# Patient Record
Sex: Female | Born: 1995 | Race: White | Hispanic: No | Marital: Single | Smoking: Current every day smoker
Health system: Southern US, Community
[De-identification: ages and names within clinical notes are randomized; demographics above are authoritative.]

## PROBLEM LIST (undated history)

## (undated) DIAGNOSIS — Z789 Other specified health status: Secondary | ICD-10-CM

## (undated) DIAGNOSIS — Q8500 Neurofibromatosis, unspecified: Secondary | ICD-10-CM

## (undated) HISTORY — PX: CARPAL TUNNEL RELEASE: SHX101

---
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
## 2017-08-08 DIAGNOSIS — F331 Major depressive disorder, recurrent, moderate: Secondary | ICD-10-CM | POA: Insufficient documentation

## 2017-08-08 DIAGNOSIS — F4310 Post-traumatic stress disorder, unspecified: Secondary | ICD-10-CM | POA: Insufficient documentation

## 2019-06-29 DIAGNOSIS — M7702 Medial epicondylitis, left elbow: Secondary | ICD-10-CM | POA: Insufficient documentation

## 2019-06-29 DIAGNOSIS — G5622 Lesion of ulnar nerve, left upper limb: Secondary | ICD-10-CM | POA: Insufficient documentation

## 2020-02-07 DIAGNOSIS — K219 Gastro-esophageal reflux disease without esophagitis: Secondary | ICD-10-CM | POA: Diagnosis not present

## 2020-02-07 DIAGNOSIS — F411 Generalized anxiety disorder: Secondary | ICD-10-CM | POA: Diagnosis not present

## 2020-02-07 DIAGNOSIS — F332 Major depressive disorder, recurrent severe without psychotic features: Secondary | ICD-10-CM | POA: Diagnosis not present

## 2020-02-07 DIAGNOSIS — R5383 Other fatigue: Secondary | ICD-10-CM | POA: Diagnosis not present

## 2020-02-07 DIAGNOSIS — Q8501 Neurofibromatosis, type 1: Secondary | ICD-10-CM | POA: Diagnosis not present

## 2020-02-12 DIAGNOSIS — R509 Fever, unspecified: Secondary | ICD-10-CM | POA: Diagnosis not present

## 2020-02-12 DIAGNOSIS — R319 Hematuria, unspecified: Secondary | ICD-10-CM | POA: Diagnosis not present

## 2020-02-12 DIAGNOSIS — R110 Nausea: Secondary | ICD-10-CM | POA: Diagnosis not present

## 2020-02-12 DIAGNOSIS — N136 Pyonephrosis: Secondary | ICD-10-CM | POA: Diagnosis not present

## 2020-02-12 DIAGNOSIS — Z3202 Encounter for pregnancy test, result negative: Secondary | ICD-10-CM | POA: Diagnosis not present

## 2020-02-12 DIAGNOSIS — N200 Calculus of kidney: Secondary | ICD-10-CM | POA: Diagnosis not present

## 2020-02-13 DIAGNOSIS — R319 Hematuria, unspecified: Secondary | ICD-10-CM | POA: Diagnosis not present

## 2020-02-20 DIAGNOSIS — Z975 Presence of (intrauterine) contraceptive device: Secondary | ICD-10-CM | POA: Diagnosis not present

## 2020-02-20 DIAGNOSIS — A599 Trichomoniasis, unspecified: Secondary | ICD-10-CM | POA: Diagnosis not present

## 2020-02-20 DIAGNOSIS — N201 Calculus of ureter: Secondary | ICD-10-CM | POA: Diagnosis not present

## 2020-02-20 DIAGNOSIS — Z87442 Personal history of urinary calculi: Secondary | ICD-10-CM | POA: Diagnosis not present

## 2020-02-20 DIAGNOSIS — R1031 Right lower quadrant pain: Secondary | ICD-10-CM | POA: Diagnosis not present

## 2020-02-20 DIAGNOSIS — R109 Unspecified abdominal pain: Secondary | ICD-10-CM | POA: Diagnosis not present

## 2020-02-24 DIAGNOSIS — Z87442 Personal history of urinary calculi: Secondary | ICD-10-CM | POA: Diagnosis not present

## 2020-02-24 DIAGNOSIS — Z79899 Other long term (current) drug therapy: Secondary | ICD-10-CM | POA: Diagnosis not present

## 2020-02-24 DIAGNOSIS — F419 Anxiety disorder, unspecified: Secondary | ICD-10-CM | POA: Diagnosis not present

## 2020-02-24 DIAGNOSIS — F329 Major depressive disorder, single episode, unspecified: Secondary | ICD-10-CM | POA: Diagnosis not present

## 2020-02-24 DIAGNOSIS — N201 Calculus of ureter: Secondary | ICD-10-CM | POA: Diagnosis not present

## 2020-02-24 DIAGNOSIS — K5900 Constipation, unspecified: Secondary | ICD-10-CM | POA: Diagnosis not present

## 2020-02-24 DIAGNOSIS — Q8500 Neurofibromatosis, unspecified: Secondary | ICD-10-CM | POA: Diagnosis not present

## 2020-02-24 DIAGNOSIS — R42 Dizziness and giddiness: Secondary | ICD-10-CM | POA: Diagnosis not present

## 2020-02-24 DIAGNOSIS — M419 Scoliosis, unspecified: Secondary | ICD-10-CM | POA: Diagnosis not present

## 2020-02-24 DIAGNOSIS — R339 Retention of urine, unspecified: Secondary | ICD-10-CM | POA: Diagnosis not present

## 2020-02-24 DIAGNOSIS — N1330 Unspecified hydronephrosis: Secondary | ICD-10-CM | POA: Diagnosis not present

## 2020-02-24 DIAGNOSIS — R000 Tachycardia, unspecified: Secondary | ICD-10-CM | POA: Diagnosis not present

## 2020-02-24 DIAGNOSIS — N390 Urinary tract infection, site not specified: Secondary | ICD-10-CM | POA: Diagnosis not present

## 2020-02-24 DIAGNOSIS — Z793 Long term (current) use of hormonal contraceptives: Secondary | ICD-10-CM | POA: Diagnosis not present

## 2020-02-24 DIAGNOSIS — N132 Hydronephrosis with renal and ureteral calculous obstruction: Secondary | ICD-10-CM | POA: Diagnosis not present

## 2020-02-24 DIAGNOSIS — F17210 Nicotine dependence, cigarettes, uncomplicated: Secondary | ICD-10-CM | POA: Diagnosis not present

## 2020-02-27 DIAGNOSIS — Z466 Encounter for fitting and adjustment of urinary device: Secondary | ICD-10-CM | POA: Diagnosis not present

## 2020-02-27 DIAGNOSIS — R339 Retention of urine, unspecified: Secondary | ICD-10-CM | POA: Diagnosis not present

## 2020-03-05 DIAGNOSIS — N200 Calculus of kidney: Secondary | ICD-10-CM | POA: Diagnosis not present

## 2020-03-05 DIAGNOSIS — N201 Calculus of ureter: Secondary | ICD-10-CM | POA: Diagnosis not present

## 2020-03-12 DIAGNOSIS — N200 Calculus of kidney: Secondary | ICD-10-CM | POA: Diagnosis not present

## 2020-03-12 DIAGNOSIS — Z8619 Personal history of other infectious and parasitic diseases: Secondary | ICD-10-CM | POA: Diagnosis not present

## 2020-03-12 DIAGNOSIS — F5104 Psychophysiologic insomnia: Secondary | ICD-10-CM | POA: Diagnosis not present

## 2020-03-12 DIAGNOSIS — F332 Major depressive disorder, recurrent severe without psychotic features: Secondary | ICD-10-CM | POA: Diagnosis not present

## 2020-05-16 DIAGNOSIS — J020 Streptococcal pharyngitis: Secondary | ICD-10-CM | POA: Diagnosis not present

## 2020-05-16 DIAGNOSIS — Z20822 Contact with and (suspected) exposure to covid-19: Secondary | ICD-10-CM | POA: Diagnosis not present

## 2020-05-16 DIAGNOSIS — J029 Acute pharyngitis, unspecified: Secondary | ICD-10-CM | POA: Diagnosis not present

## 2020-05-16 DIAGNOSIS — M549 Dorsalgia, unspecified: Secondary | ICD-10-CM | POA: Diagnosis not present

## 2020-05-16 DIAGNOSIS — B349 Viral infection, unspecified: Secondary | ICD-10-CM | POA: Diagnosis not present

## 2020-05-22 DIAGNOSIS — Q8501 Neurofibromatosis, type 1: Secondary | ICD-10-CM | POA: Diagnosis not present

## 2020-05-22 DIAGNOSIS — N644 Mastodynia: Secondary | ICD-10-CM | POA: Diagnosis not present

## 2020-05-22 DIAGNOSIS — F331 Major depressive disorder, recurrent, moderate: Secondary | ICD-10-CM | POA: Diagnosis not present

## 2020-05-22 DIAGNOSIS — F4310 Post-traumatic stress disorder, unspecified: Secondary | ICD-10-CM | POA: Diagnosis not present

## 2020-05-22 DIAGNOSIS — I959 Hypotension, unspecified: Secondary | ICD-10-CM | POA: Diagnosis not present

## 2020-05-22 DIAGNOSIS — F332 Major depressive disorder, recurrent severe without psychotic features: Secondary | ICD-10-CM | POA: Diagnosis not present

## 2020-05-27 DIAGNOSIS — N644 Mastodynia: Secondary | ICD-10-CM | POA: Diagnosis not present

## 2020-07-16 DIAGNOSIS — R6883 Chills (without fever): Secondary | ICD-10-CM | POA: Diagnosis not present

## 2020-07-16 DIAGNOSIS — F32A Depression, unspecified: Secondary | ICD-10-CM | POA: Diagnosis not present

## 2020-07-16 DIAGNOSIS — R059 Cough, unspecified: Secondary | ICD-10-CM | POA: Diagnosis not present

## 2020-07-16 DIAGNOSIS — K0889 Other specified disorders of teeth and supporting structures: Secondary | ICD-10-CM | POA: Diagnosis not present

## 2020-07-16 DIAGNOSIS — F419 Anxiety disorder, unspecified: Secondary | ICD-10-CM | POA: Diagnosis not present

## 2020-07-16 DIAGNOSIS — Z20822 Contact with and (suspected) exposure to covid-19: Secondary | ICD-10-CM | POA: Diagnosis not present

## 2020-07-16 DIAGNOSIS — F17210 Nicotine dependence, cigarettes, uncomplicated: Secondary | ICD-10-CM | POA: Diagnosis not present

## 2020-07-16 DIAGNOSIS — R509 Fever, unspecified: Secondary | ICD-10-CM | POA: Diagnosis not present

## 2020-07-16 DIAGNOSIS — R000 Tachycardia, unspecified: Secondary | ICD-10-CM | POA: Diagnosis not present

## 2020-07-16 DIAGNOSIS — Z79899 Other long term (current) drug therapy: Secondary | ICD-10-CM | POA: Diagnosis not present

## 2020-07-16 DIAGNOSIS — R5383 Other fatigue: Secondary | ICD-10-CM | POA: Diagnosis not present

## 2020-07-16 DIAGNOSIS — Z975 Presence of (intrauterine) contraceptive device: Secondary | ICD-10-CM | POA: Diagnosis not present

## 2020-07-16 DIAGNOSIS — J029 Acute pharyngitis, unspecified: Secondary | ICD-10-CM | POA: Diagnosis not present

## 2020-07-16 DIAGNOSIS — R6884 Jaw pain: Secondary | ICD-10-CM | POA: Diagnosis not present

## 2020-07-19 DIAGNOSIS — F32A Depression, unspecified: Secondary | ICD-10-CM | POA: Diagnosis not present

## 2020-07-19 DIAGNOSIS — Q8500 Neurofibromatosis, unspecified: Secondary | ICD-10-CM | POA: Diagnosis not present

## 2020-07-19 DIAGNOSIS — F419 Anxiety disorder, unspecified: Secondary | ICD-10-CM | POA: Diagnosis not present

## 2020-07-19 DIAGNOSIS — J329 Chronic sinusitis, unspecified: Secondary | ICD-10-CM | POA: Diagnosis not present

## 2020-07-19 DIAGNOSIS — Z975 Presence of (intrauterine) contraceptive device: Secondary | ICD-10-CM | POA: Diagnosis not present

## 2020-07-19 DIAGNOSIS — R509 Fever, unspecified: Secondary | ICD-10-CM | POA: Diagnosis not present

## 2020-07-19 DIAGNOSIS — F17210 Nicotine dependence, cigarettes, uncomplicated: Secondary | ICD-10-CM | POA: Diagnosis not present

## 2020-07-19 DIAGNOSIS — Z79899 Other long term (current) drug therapy: Secondary | ICD-10-CM | POA: Diagnosis not present

## 2020-07-30 DIAGNOSIS — M26622 Arthralgia of left temporomandibular joint: Secondary | ICD-10-CM | POA: Diagnosis not present

## 2020-07-30 DIAGNOSIS — M26629 Arthralgia of temporomandibular joint, unspecified side: Secondary | ICD-10-CM | POA: Diagnosis not present

## 2020-07-30 DIAGNOSIS — R310 Gross hematuria: Secondary | ICD-10-CM | POA: Diagnosis not present

## 2020-09-19 DIAGNOSIS — Q8501 Neurofibromatosis, type 1: Secondary | ICD-10-CM | POA: Diagnosis not present

## 2020-09-19 DIAGNOSIS — M26602 Left temporomandibular joint disorder, unspecified: Secondary | ICD-10-CM | POA: Diagnosis not present

## 2020-09-19 DIAGNOSIS — L813 Cafe au lait spots: Secondary | ICD-10-CM | POA: Diagnosis not present

## 2020-09-19 DIAGNOSIS — Q8500 Neurofibromatosis, unspecified: Secondary | ICD-10-CM | POA: Diagnosis not present

## 2020-09-19 DIAGNOSIS — K5900 Constipation, unspecified: Secondary | ICD-10-CM | POA: Diagnosis not present

## 2020-09-19 DIAGNOSIS — Z973 Presence of spectacles and contact lenses: Secondary | ICD-10-CM | POA: Diagnosis not present

## 2020-09-19 DIAGNOSIS — Z975 Presence of (intrauterine) contraceptive device: Secondary | ICD-10-CM | POA: Diagnosis not present

## 2020-09-19 DIAGNOSIS — Z72820 Sleep deprivation: Secondary | ICD-10-CM | POA: Diagnosis not present

## 2020-09-19 DIAGNOSIS — L812 Freckles: Secondary | ICD-10-CM | POA: Diagnosis not present

## 2020-09-19 DIAGNOSIS — G43109 Migraine with aura, not intractable, without status migrainosus: Secondary | ICD-10-CM | POA: Diagnosis not present

## 2020-09-19 DIAGNOSIS — N6459 Other signs and symptoms in breast: Secondary | ICD-10-CM | POA: Diagnosis not present

## 2020-09-19 DIAGNOSIS — H2189 Other specified disorders of iris and ciliary body: Secondary | ICD-10-CM | POA: Diagnosis not present

## 2020-09-19 DIAGNOSIS — N898 Other specified noninflammatory disorders of vagina: Secondary | ICD-10-CM | POA: Diagnosis not present

## 2020-10-02 DIAGNOSIS — J209 Acute bronchitis, unspecified: Secondary | ICD-10-CM | POA: Diagnosis not present

## 2020-10-02 DIAGNOSIS — Z791 Long term (current) use of non-steroidal anti-inflammatories (NSAID): Secondary | ICD-10-CM | POA: Diagnosis not present

## 2020-10-02 DIAGNOSIS — R059 Cough, unspecified: Secondary | ICD-10-CM | POA: Diagnosis not present

## 2020-10-02 DIAGNOSIS — F17210 Nicotine dependence, cigarettes, uncomplicated: Secondary | ICD-10-CM | POA: Diagnosis not present

## 2020-10-02 DIAGNOSIS — J40 Bronchitis, not specified as acute or chronic: Secondary | ICD-10-CM | POA: Diagnosis not present

## 2020-10-02 DIAGNOSIS — Q8500 Neurofibromatosis, unspecified: Secondary | ICD-10-CM | POA: Diagnosis not present

## 2020-10-02 DIAGNOSIS — Z79899 Other long term (current) drug therapy: Secondary | ICD-10-CM | POA: Diagnosis not present

## 2020-10-02 DIAGNOSIS — F32A Depression, unspecified: Secondary | ICD-10-CM | POA: Diagnosis not present

## 2020-10-02 DIAGNOSIS — Z975 Presence of (intrauterine) contraceptive device: Secondary | ICD-10-CM | POA: Diagnosis not present

## 2020-10-02 DIAGNOSIS — Z20822 Contact with and (suspected) exposure to covid-19: Secondary | ICD-10-CM | POA: Diagnosis not present

## 2020-10-08 DIAGNOSIS — G5602 Carpal tunnel syndrome, left upper limb: Secondary | ICD-10-CM | POA: Diagnosis not present

## 2020-10-08 DIAGNOSIS — G5622 Lesion of ulnar nerve, left upper limb: Secondary | ICD-10-CM | POA: Diagnosis not present

## 2020-10-15 DIAGNOSIS — F909 Attention-deficit hyperactivity disorder, unspecified type: Secondary | ICD-10-CM | POA: Diagnosis not present

## 2020-10-15 DIAGNOSIS — F411 Generalized anxiety disorder: Secondary | ICD-10-CM | POA: Diagnosis not present

## 2020-11-04 DIAGNOSIS — F902 Attention-deficit hyperactivity disorder, combined type: Secondary | ICD-10-CM | POA: Diagnosis not present

## 2020-11-04 DIAGNOSIS — Z01818 Encounter for other preprocedural examination: Secondary | ICD-10-CM | POA: Diagnosis not present

## 2020-11-04 DIAGNOSIS — F439 Reaction to severe stress, unspecified: Secondary | ICD-10-CM | POA: Diagnosis not present

## 2020-11-11 DIAGNOSIS — G5602 Carpal tunnel syndrome, left upper limb: Secondary | ICD-10-CM | POA: Diagnosis not present

## 2020-11-25 DIAGNOSIS — G5602 Carpal tunnel syndrome, left upper limb: Secondary | ICD-10-CM | POA: Diagnosis not present

## 2020-11-25 DIAGNOSIS — Z4789 Encounter for other orthopedic aftercare: Secondary | ICD-10-CM | POA: Diagnosis not present

## 2020-11-27 DIAGNOSIS — F902 Attention-deficit hyperactivity disorder, combined type: Secondary | ICD-10-CM | POA: Diagnosis not present

## 2020-11-28 DIAGNOSIS — R202 Paresthesia of skin: Secondary | ICD-10-CM | POA: Diagnosis not present

## 2020-11-28 DIAGNOSIS — Z789 Other specified health status: Secondary | ICD-10-CM | POA: Diagnosis not present

## 2020-11-28 DIAGNOSIS — R531 Weakness: Secondary | ICD-10-CM | POA: Diagnosis not present

## 2020-11-28 DIAGNOSIS — G5602 Carpal tunnel syndrome, left upper limb: Secondary | ICD-10-CM | POA: Diagnosis not present

## 2020-12-03 DIAGNOSIS — F902 Attention-deficit hyperactivity disorder, combined type: Secondary | ICD-10-CM | POA: Insufficient documentation

## 2020-12-12 DIAGNOSIS — G5602 Carpal tunnel syndrome, left upper limb: Secondary | ICD-10-CM | POA: Diagnosis not present

## 2020-12-12 DIAGNOSIS — R202 Paresthesia of skin: Secondary | ICD-10-CM | POA: Diagnosis not present

## 2020-12-12 DIAGNOSIS — Z789 Other specified health status: Secondary | ICD-10-CM | POA: Diagnosis not present

## 2020-12-12 DIAGNOSIS — R531 Weakness: Secondary | ICD-10-CM | POA: Diagnosis not present

## 2020-12-18 DIAGNOSIS — M25522 Pain in left elbow: Secondary | ICD-10-CM | POA: Diagnosis not present

## 2020-12-18 DIAGNOSIS — Z419 Encounter for procedure for purposes other than remedying health state, unspecified: Secondary | ICD-10-CM | POA: Diagnosis not present

## 2020-12-18 DIAGNOSIS — M7702 Medial epicondylitis, left elbow: Secondary | ICD-10-CM | POA: Diagnosis not present

## 2020-12-18 DIAGNOSIS — R531 Weakness: Secondary | ICD-10-CM | POA: Diagnosis not present

## 2020-12-18 DIAGNOSIS — G5602 Carpal tunnel syndrome, left upper limb: Secondary | ICD-10-CM | POA: Diagnosis not present

## 2020-12-18 DIAGNOSIS — R202 Paresthesia of skin: Secondary | ICD-10-CM | POA: Diagnosis not present

## 2020-12-18 DIAGNOSIS — Z789 Other specified health status: Secondary | ICD-10-CM | POA: Diagnosis not present

## 2020-12-26 DIAGNOSIS — G5602 Carpal tunnel syndrome, left upper limb: Secondary | ICD-10-CM | POA: Diagnosis not present

## 2020-12-26 DIAGNOSIS — M7702 Medial epicondylitis, left elbow: Secondary | ICD-10-CM | POA: Diagnosis not present

## 2020-12-26 DIAGNOSIS — M25522 Pain in left elbow: Secondary | ICD-10-CM | POA: Diagnosis not present

## 2020-12-26 DIAGNOSIS — Z789 Other specified health status: Secondary | ICD-10-CM | POA: Diagnosis not present

## 2020-12-26 DIAGNOSIS — R202 Paresthesia of skin: Secondary | ICD-10-CM | POA: Diagnosis not present

## 2020-12-26 DIAGNOSIS — R531 Weakness: Secondary | ICD-10-CM | POA: Diagnosis not present

## 2020-12-29 DIAGNOSIS — G5622 Lesion of ulnar nerve, left upper limb: Secondary | ICD-10-CM | POA: Diagnosis not present

## 2020-12-29 DIAGNOSIS — Z48811 Encounter for surgical aftercare following surgery on the nervous system: Secondary | ICD-10-CM | POA: Diagnosis not present

## 2020-12-29 DIAGNOSIS — G5602 Carpal tunnel syndrome, left upper limb: Secondary | ICD-10-CM | POA: Diagnosis not present

## 2021-01-01 DIAGNOSIS — F331 Major depressive disorder, recurrent, moderate: Secondary | ICD-10-CM | POA: Diagnosis not present

## 2021-01-01 DIAGNOSIS — F4310 Post-traumatic stress disorder, unspecified: Secondary | ICD-10-CM | POA: Diagnosis not present

## 2021-01-01 DIAGNOSIS — F902 Attention-deficit hyperactivity disorder, combined type: Secondary | ICD-10-CM | POA: Diagnosis not present

## 2021-01-01 DIAGNOSIS — F411 Generalized anxiety disorder: Secondary | ICD-10-CM | POA: Diagnosis not present

## 2021-01-01 DIAGNOSIS — F603 Borderline personality disorder: Secondary | ICD-10-CM | POA: Diagnosis not present

## 2021-01-05 DIAGNOSIS — R202 Paresthesia of skin: Secondary | ICD-10-CM | POA: Diagnosis not present

## 2021-01-05 DIAGNOSIS — Z789 Other specified health status: Secondary | ICD-10-CM | POA: Diagnosis not present

## 2021-01-05 DIAGNOSIS — M25522 Pain in left elbow: Secondary | ICD-10-CM | POA: Diagnosis not present

## 2021-01-05 DIAGNOSIS — R531 Weakness: Secondary | ICD-10-CM | POA: Diagnosis not present

## 2021-01-05 DIAGNOSIS — G5602 Carpal tunnel syndrome, left upper limb: Secondary | ICD-10-CM | POA: Diagnosis not present

## 2021-01-05 DIAGNOSIS — M7702 Medial epicondylitis, left elbow: Secondary | ICD-10-CM | POA: Diagnosis not present

## 2021-01-08 DIAGNOSIS — F4310 Post-traumatic stress disorder, unspecified: Secondary | ICD-10-CM | POA: Diagnosis not present

## 2021-01-08 DIAGNOSIS — F410 Panic disorder [episodic paroxysmal anxiety] without agoraphobia: Secondary | ICD-10-CM | POA: Diagnosis not present

## 2021-01-08 DIAGNOSIS — Z8659 Personal history of other mental and behavioral disorders: Secondary | ICD-10-CM | POA: Diagnosis not present

## 2021-01-08 DIAGNOSIS — F902 Attention-deficit hyperactivity disorder, combined type: Secondary | ICD-10-CM | POA: Diagnosis not present

## 2021-01-08 DIAGNOSIS — F411 Generalized anxiety disorder: Secondary | ICD-10-CM | POA: Diagnosis not present

## 2021-01-08 DIAGNOSIS — F331 Major depressive disorder, recurrent, moderate: Secondary | ICD-10-CM | POA: Diagnosis not present

## 2021-01-09 DIAGNOSIS — G5602 Carpal tunnel syndrome, left upper limb: Secondary | ICD-10-CM | POA: Diagnosis not present

## 2021-01-09 DIAGNOSIS — R531 Weakness: Secondary | ICD-10-CM | POA: Diagnosis not present

## 2021-01-09 DIAGNOSIS — R202 Paresthesia of skin: Secondary | ICD-10-CM | POA: Diagnosis not present

## 2021-01-09 DIAGNOSIS — M25522 Pain in left elbow: Secondary | ICD-10-CM | POA: Diagnosis not present

## 2021-01-09 DIAGNOSIS — M7702 Medial epicondylitis, left elbow: Secondary | ICD-10-CM | POA: Diagnosis not present

## 2021-01-09 DIAGNOSIS — Z789 Other specified health status: Secondary | ICD-10-CM | POA: Diagnosis not present

## 2021-01-18 DIAGNOSIS — Z419 Encounter for procedure for purposes other than remedying health state, unspecified: Secondary | ICD-10-CM | POA: Diagnosis not present

## 2021-01-23 DIAGNOSIS — Z789 Other specified health status: Secondary | ICD-10-CM | POA: Diagnosis not present

## 2021-01-23 DIAGNOSIS — R202 Paresthesia of skin: Secondary | ICD-10-CM | POA: Diagnosis not present

## 2021-01-23 DIAGNOSIS — M7702 Medial epicondylitis, left elbow: Secondary | ICD-10-CM | POA: Diagnosis not present

## 2021-01-23 DIAGNOSIS — M25522 Pain in left elbow: Secondary | ICD-10-CM | POA: Diagnosis not present

## 2021-01-23 DIAGNOSIS — R531 Weakness: Secondary | ICD-10-CM | POA: Diagnosis not present

## 2021-01-23 DIAGNOSIS — G5602 Carpal tunnel syndrome, left upper limb: Secondary | ICD-10-CM | POA: Diagnosis not present

## 2021-01-29 DIAGNOSIS — Z789 Other specified health status: Secondary | ICD-10-CM | POA: Diagnosis not present

## 2021-01-29 DIAGNOSIS — M7702 Medial epicondylitis, left elbow: Secondary | ICD-10-CM | POA: Diagnosis not present

## 2021-01-29 DIAGNOSIS — R531 Weakness: Secondary | ICD-10-CM | POA: Diagnosis not present

## 2021-01-29 DIAGNOSIS — M25522 Pain in left elbow: Secondary | ICD-10-CM | POA: Diagnosis not present

## 2021-01-29 DIAGNOSIS — R202 Paresthesia of skin: Secondary | ICD-10-CM | POA: Diagnosis not present

## 2021-01-29 DIAGNOSIS — G5602 Carpal tunnel syndrome, left upper limb: Secondary | ICD-10-CM | POA: Diagnosis not present

## 2021-02-04 DIAGNOSIS — F902 Attention-deficit hyperactivity disorder, combined type: Secondary | ICD-10-CM | POA: Diagnosis not present

## 2021-02-04 DIAGNOSIS — F411 Generalized anxiety disorder: Secondary | ICD-10-CM | POA: Diagnosis not present

## 2021-02-04 DIAGNOSIS — F331 Major depressive disorder, recurrent, moderate: Secondary | ICD-10-CM | POA: Diagnosis not present

## 2021-02-04 DIAGNOSIS — F603 Borderline personality disorder: Secondary | ICD-10-CM | POA: Diagnosis not present

## 2021-02-04 DIAGNOSIS — F4310 Post-traumatic stress disorder, unspecified: Secondary | ICD-10-CM | POA: Diagnosis not present

## 2021-02-10 DIAGNOSIS — R531 Weakness: Secondary | ICD-10-CM | POA: Diagnosis not present

## 2021-02-10 DIAGNOSIS — Z789 Other specified health status: Secondary | ICD-10-CM | POA: Diagnosis not present

## 2021-02-10 DIAGNOSIS — M25522 Pain in left elbow: Secondary | ICD-10-CM | POA: Diagnosis not present

## 2021-02-10 DIAGNOSIS — G5602 Carpal tunnel syndrome, left upper limb: Secondary | ICD-10-CM | POA: Diagnosis not present

## 2021-02-10 DIAGNOSIS — R202 Paresthesia of skin: Secondary | ICD-10-CM | POA: Diagnosis not present

## 2021-02-10 DIAGNOSIS — M7702 Medial epicondylitis, left elbow: Secondary | ICD-10-CM | POA: Diagnosis not present

## 2021-02-12 DIAGNOSIS — G5602 Carpal tunnel syndrome, left upper limb: Secondary | ICD-10-CM | POA: Diagnosis not present

## 2021-02-12 DIAGNOSIS — M7702 Medial epicondylitis, left elbow: Secondary | ICD-10-CM | POA: Diagnosis not present

## 2021-02-12 DIAGNOSIS — R531 Weakness: Secondary | ICD-10-CM | POA: Diagnosis not present

## 2021-02-12 DIAGNOSIS — Z789 Other specified health status: Secondary | ICD-10-CM | POA: Diagnosis not present

## 2021-02-12 DIAGNOSIS — M25522 Pain in left elbow: Secondary | ICD-10-CM | POA: Diagnosis not present

## 2021-02-12 DIAGNOSIS — R202 Paresthesia of skin: Secondary | ICD-10-CM | POA: Diagnosis not present

## 2021-02-18 DIAGNOSIS — Z419 Encounter for procedure for purposes other than remedying health state, unspecified: Secondary | ICD-10-CM | POA: Diagnosis not present

## 2021-02-19 DIAGNOSIS — Z8659 Personal history of other mental and behavioral disorders: Secondary | ICD-10-CM | POA: Diagnosis not present

## 2021-02-19 DIAGNOSIS — F902 Attention-deficit hyperactivity disorder, combined type: Secondary | ICD-10-CM | POA: Diagnosis not present

## 2021-02-19 DIAGNOSIS — F410 Panic disorder [episodic paroxysmal anxiety] without agoraphobia: Secondary | ICD-10-CM | POA: Diagnosis not present

## 2021-02-19 DIAGNOSIS — F331 Major depressive disorder, recurrent, moderate: Secondary | ICD-10-CM | POA: Diagnosis not present

## 2021-02-19 DIAGNOSIS — F411 Generalized anxiety disorder: Secondary | ICD-10-CM | POA: Diagnosis not present

## 2021-02-19 DIAGNOSIS — F4310 Post-traumatic stress disorder, unspecified: Secondary | ICD-10-CM | POA: Diagnosis not present

## 2021-03-04 DIAGNOSIS — F603 Borderline personality disorder: Secondary | ICD-10-CM | POA: Diagnosis not present

## 2021-03-04 DIAGNOSIS — F902 Attention-deficit hyperactivity disorder, combined type: Secondary | ICD-10-CM | POA: Diagnosis not present

## 2021-03-04 DIAGNOSIS — F411 Generalized anxiety disorder: Secondary | ICD-10-CM | POA: Diagnosis not present

## 2021-03-04 DIAGNOSIS — F331 Major depressive disorder, recurrent, moderate: Secondary | ICD-10-CM | POA: Diagnosis not present

## 2021-03-04 DIAGNOSIS — F4310 Post-traumatic stress disorder, unspecified: Secondary | ICD-10-CM | POA: Diagnosis not present

## 2021-03-18 DIAGNOSIS — Z419 Encounter for procedure for purposes other than remedying health state, unspecified: Secondary | ICD-10-CM | POA: Diagnosis not present

## 2021-03-25 DIAGNOSIS — Z8659 Personal history of other mental and behavioral disorders: Secondary | ICD-10-CM | POA: Diagnosis not present

## 2021-03-25 DIAGNOSIS — F902 Attention-deficit hyperactivity disorder, combined type: Secondary | ICD-10-CM | POA: Diagnosis not present

## 2021-03-25 DIAGNOSIS — F411 Generalized anxiety disorder: Secondary | ICD-10-CM | POA: Diagnosis not present

## 2021-03-25 DIAGNOSIS — F410 Panic disorder [episodic paroxysmal anxiety] without agoraphobia: Secondary | ICD-10-CM | POA: Diagnosis not present

## 2021-03-25 DIAGNOSIS — F331 Major depressive disorder, recurrent, moderate: Secondary | ICD-10-CM | POA: Diagnosis not present

## 2021-04-18 DIAGNOSIS — Z419 Encounter for procedure for purposes other than remedying health state, unspecified: Secondary | ICD-10-CM | POA: Diagnosis not present

## 2021-04-27 DIAGNOSIS — Z3201 Encounter for pregnancy test, result positive: Secondary | ICD-10-CM | POA: Diagnosis not present

## 2021-04-29 DIAGNOSIS — Z3201 Encounter for pregnancy test, result positive: Secondary | ICD-10-CM | POA: Diagnosis not present

## 2021-05-01 DIAGNOSIS — F331 Major depressive disorder, recurrent, moderate: Secondary | ICD-10-CM | POA: Diagnosis not present

## 2021-05-01 DIAGNOSIS — F17200 Nicotine dependence, unspecified, uncomplicated: Secondary | ICD-10-CM | POA: Insufficient documentation

## 2021-05-01 DIAGNOSIS — F411 Generalized anxiety disorder: Secondary | ICD-10-CM | POA: Diagnosis not present

## 2021-05-01 DIAGNOSIS — F4310 Post-traumatic stress disorder, unspecified: Secondary | ICD-10-CM | POA: Diagnosis not present

## 2021-05-01 DIAGNOSIS — F410 Panic disorder [episodic paroxysmal anxiety] without agoraphobia: Secondary | ICD-10-CM | POA: Diagnosis not present

## 2021-05-01 DIAGNOSIS — F902 Attention-deficit hyperactivity disorder, combined type: Secondary | ICD-10-CM | POA: Diagnosis not present

## 2021-05-01 DIAGNOSIS — Z8659 Personal history of other mental and behavioral disorders: Secondary | ICD-10-CM | POA: Diagnosis not present

## 2021-05-05 DIAGNOSIS — F17210 Nicotine dependence, cigarettes, uncomplicated: Secondary | ICD-10-CM | POA: Diagnosis not present

## 2021-05-05 DIAGNOSIS — O0001 Abdominal pregnancy with intrauterine pregnancy: Secondary | ICD-10-CM | POA: Diagnosis not present

## 2021-05-05 DIAGNOSIS — Z79899 Other long term (current) drug therapy: Secondary | ICD-10-CM | POA: Diagnosis not present

## 2021-05-05 DIAGNOSIS — O26899 Other specified pregnancy related conditions, unspecified trimester: Secondary | ICD-10-CM | POA: Diagnosis not present

## 2021-05-05 DIAGNOSIS — Z3A01 Less than 8 weeks gestation of pregnancy: Secondary | ICD-10-CM | POA: Diagnosis not present

## 2021-05-05 DIAGNOSIS — O2651 Maternal hypotension syndrome, first trimester: Secondary | ICD-10-CM | POA: Diagnosis not present

## 2021-05-05 DIAGNOSIS — I951 Orthostatic hypotension: Secondary | ICD-10-CM | POA: Diagnosis not present

## 2021-05-05 DIAGNOSIS — F419 Anxiety disorder, unspecified: Secondary | ICD-10-CM | POA: Diagnosis not present

## 2021-05-05 DIAGNOSIS — F339 Major depressive disorder, recurrent, unspecified: Secondary | ICD-10-CM | POA: Diagnosis not present

## 2021-05-05 DIAGNOSIS — O26891 Other specified pregnancy related conditions, first trimester: Secondary | ICD-10-CM | POA: Diagnosis not present

## 2021-05-13 DIAGNOSIS — Z3687 Encounter for antenatal screening for uncertain dates: Secondary | ICD-10-CM | POA: Diagnosis not present

## 2021-05-13 DIAGNOSIS — Z3481 Encounter for supervision of other normal pregnancy, first trimester: Secondary | ICD-10-CM | POA: Diagnosis not present

## 2021-05-18 DIAGNOSIS — Z419 Encounter for procedure for purposes other than remedying health state, unspecified: Secondary | ICD-10-CM | POA: Diagnosis not present

## 2021-05-22 DIAGNOSIS — F17210 Nicotine dependence, cigarettes, uncomplicated: Secondary | ICD-10-CM | POA: Diagnosis not present

## 2021-05-22 DIAGNOSIS — U071 COVID-19: Secondary | ICD-10-CM | POA: Diagnosis not present

## 2021-05-22 DIAGNOSIS — Z3A01 Less than 8 weeks gestation of pregnancy: Secondary | ICD-10-CM | POA: Diagnosis not present

## 2021-05-22 DIAGNOSIS — R509 Fever, unspecified: Secondary | ICD-10-CM | POA: Diagnosis not present

## 2021-05-22 DIAGNOSIS — Z3A08 8 weeks gestation of pregnancy: Secondary | ICD-10-CM | POA: Diagnosis not present

## 2021-05-22 DIAGNOSIS — R0981 Nasal congestion: Secondary | ICD-10-CM | POA: Diagnosis not present

## 2021-05-22 DIAGNOSIS — M791 Myalgia, unspecified site: Secondary | ICD-10-CM | POA: Diagnosis not present

## 2021-05-22 DIAGNOSIS — O26891 Other specified pregnancy related conditions, first trimester: Secondary | ICD-10-CM | POA: Diagnosis not present

## 2021-05-22 DIAGNOSIS — R5383 Other fatigue: Secondary | ICD-10-CM | POA: Diagnosis not present

## 2021-05-22 DIAGNOSIS — Z79899 Other long term (current) drug therapy: Secondary | ICD-10-CM | POA: Diagnosis not present

## 2021-05-24 DIAGNOSIS — Z3A08 8 weeks gestation of pregnancy: Secondary | ICD-10-CM | POA: Diagnosis not present

## 2021-05-24 DIAGNOSIS — O200 Threatened abortion: Secondary | ICD-10-CM | POA: Diagnosis not present

## 2021-05-25 DIAGNOSIS — O26851 Spotting complicating pregnancy, first trimester: Secondary | ICD-10-CM | POA: Diagnosis not present

## 2021-06-05 DIAGNOSIS — F411 Generalized anxiety disorder: Secondary | ICD-10-CM | POA: Diagnosis not present

## 2021-06-05 DIAGNOSIS — F902 Attention-deficit hyperactivity disorder, combined type: Secondary | ICD-10-CM | POA: Diagnosis not present

## 2021-06-05 DIAGNOSIS — Z8659 Personal history of other mental and behavioral disorders: Secondary | ICD-10-CM | POA: Diagnosis not present

## 2021-06-05 DIAGNOSIS — F330 Major depressive disorder, recurrent, mild: Secondary | ICD-10-CM | POA: Diagnosis not present

## 2021-06-05 DIAGNOSIS — F4310 Post-traumatic stress disorder, unspecified: Secondary | ICD-10-CM | POA: Diagnosis not present

## 2021-06-05 DIAGNOSIS — F410 Panic disorder [episodic paroxysmal anxiety] without agoraphobia: Secondary | ICD-10-CM | POA: Diagnosis not present

## 2021-06-10 DIAGNOSIS — Z36 Encounter for antenatal screening for chromosomal anomalies: Secondary | ICD-10-CM | POA: Diagnosis not present

## 2021-06-18 DIAGNOSIS — Z419 Encounter for procedure for purposes other than remedying health state, unspecified: Secondary | ICD-10-CM | POA: Diagnosis not present

## 2021-06-19 DIAGNOSIS — Z124 Encounter for screening for malignant neoplasm of cervix: Secondary | ICD-10-CM | POA: Diagnosis not present

## 2021-06-19 DIAGNOSIS — Z113 Encounter for screening for infections with a predominantly sexual mode of transmission: Secondary | ICD-10-CM | POA: Diagnosis not present

## 2021-06-19 DIAGNOSIS — Z01419 Encounter for gynecological examination (general) (routine) without abnormal findings: Secondary | ICD-10-CM | POA: Diagnosis not present

## 2021-06-20 DIAGNOSIS — O26891 Other specified pregnancy related conditions, first trimester: Secondary | ICD-10-CM | POA: Diagnosis not present

## 2021-06-20 DIAGNOSIS — O99891 Other specified diseases and conditions complicating pregnancy: Secondary | ICD-10-CM | POA: Diagnosis not present

## 2021-06-20 DIAGNOSIS — O99331 Smoking (tobacco) complicating pregnancy, first trimester: Secondary | ICD-10-CM | POA: Diagnosis not present

## 2021-06-20 DIAGNOSIS — F17210 Nicotine dependence, cigarettes, uncomplicated: Secondary | ICD-10-CM | POA: Diagnosis not present

## 2021-06-20 DIAGNOSIS — R000 Tachycardia, unspecified: Secondary | ICD-10-CM | POA: Diagnosis not present

## 2021-06-20 DIAGNOSIS — Z3A12 12 weeks gestation of pregnancy: Secondary | ICD-10-CM | POA: Diagnosis not present

## 2021-07-01 DIAGNOSIS — M25511 Pain in right shoulder: Secondary | ICD-10-CM | POA: Diagnosis not present

## 2021-07-01 DIAGNOSIS — M62838 Other muscle spasm: Secondary | ICD-10-CM | POA: Diagnosis not present

## 2021-07-01 DIAGNOSIS — M62830 Muscle spasm of back: Secondary | ICD-10-CM | POA: Diagnosis not present

## 2021-07-01 DIAGNOSIS — F17210 Nicotine dependence, cigarettes, uncomplicated: Secondary | ICD-10-CM | POA: Diagnosis not present

## 2021-07-17 DIAGNOSIS — Z363 Encounter for antenatal screening for malformations: Secondary | ICD-10-CM | POA: Diagnosis not present

## 2021-07-18 DIAGNOSIS — Z419 Encounter for procedure for purposes other than remedying health state, unspecified: Secondary | ICD-10-CM | POA: Diagnosis not present

## 2021-07-24 DIAGNOSIS — Z8659 Personal history of other mental and behavioral disorders: Secondary | ICD-10-CM | POA: Diagnosis not present

## 2021-07-24 DIAGNOSIS — F4310 Post-traumatic stress disorder, unspecified: Secondary | ICD-10-CM | POA: Diagnosis not present

## 2021-07-24 DIAGNOSIS — F902 Attention-deficit hyperactivity disorder, combined type: Secondary | ICD-10-CM | POA: Diagnosis not present

## 2021-07-24 DIAGNOSIS — Z3A17 17 weeks gestation of pregnancy: Secondary | ICD-10-CM | POA: Diagnosis not present

## 2021-07-24 DIAGNOSIS — F410 Panic disorder [episodic paroxysmal anxiety] without agoraphobia: Secondary | ICD-10-CM | POA: Diagnosis not present

## 2021-07-24 DIAGNOSIS — F411 Generalized anxiety disorder: Secondary | ICD-10-CM | POA: Diagnosis not present

## 2021-07-24 DIAGNOSIS — F331 Major depressive disorder, recurrent, moderate: Secondary | ICD-10-CM | POA: Diagnosis not present

## 2021-08-05 DIAGNOSIS — F331 Major depressive disorder, recurrent, moderate: Secondary | ICD-10-CM | POA: Diagnosis not present

## 2021-08-05 DIAGNOSIS — F902 Attention-deficit hyperactivity disorder, combined type: Secondary | ICD-10-CM | POA: Diagnosis not present

## 2021-08-05 DIAGNOSIS — F411 Generalized anxiety disorder: Secondary | ICD-10-CM | POA: Diagnosis not present

## 2021-08-05 DIAGNOSIS — Z8659 Personal history of other mental and behavioral disorders: Secondary | ICD-10-CM | POA: Diagnosis not present

## 2021-08-05 DIAGNOSIS — F4310 Post-traumatic stress disorder, unspecified: Secondary | ICD-10-CM | POA: Diagnosis not present

## 2021-08-13 DIAGNOSIS — Z363 Encounter for antenatal screening for malformations: Secondary | ICD-10-CM | POA: Diagnosis not present

## 2021-08-13 DIAGNOSIS — R42 Dizziness and giddiness: Secondary | ICD-10-CM | POA: Diagnosis not present

## 2021-08-13 DIAGNOSIS — O99012 Anemia complicating pregnancy, second trimester: Secondary | ICD-10-CM | POA: Diagnosis not present

## 2021-08-13 DIAGNOSIS — O99332 Smoking (tobacco) complicating pregnancy, second trimester: Secondary | ICD-10-CM | POA: Diagnosis not present

## 2021-08-18 DIAGNOSIS — Z419 Encounter for procedure for purposes other than remedying health state, unspecified: Secondary | ICD-10-CM | POA: Diagnosis not present

## 2021-09-18 DIAGNOSIS — Z419 Encounter for procedure for purposes other than remedying health state, unspecified: Secondary | ICD-10-CM | POA: Diagnosis not present

## 2021-09-27 DIAGNOSIS — R102 Pelvic and perineal pain: Secondary | ICD-10-CM | POA: Diagnosis not present

## 2021-09-27 DIAGNOSIS — O26893 Other specified pregnancy related conditions, third trimester: Secondary | ICD-10-CM | POA: Diagnosis not present

## 2021-09-27 DIAGNOSIS — O368130 Decreased fetal movements, third trimester, not applicable or unspecified: Secondary | ICD-10-CM | POA: Diagnosis not present

## 2021-09-27 DIAGNOSIS — N390 Urinary tract infection, site not specified: Secondary | ICD-10-CM | POA: Diagnosis not present

## 2021-09-27 DIAGNOSIS — Z3A26 26 weeks gestation of pregnancy: Secondary | ICD-10-CM | POA: Diagnosis not present

## 2021-09-27 DIAGNOSIS — O2342 Unspecified infection of urinary tract in pregnancy, second trimester: Secondary | ICD-10-CM | POA: Diagnosis not present

## 2021-09-27 DIAGNOSIS — O2343 Unspecified infection of urinary tract in pregnancy, third trimester: Secondary | ICD-10-CM | POA: Diagnosis not present

## 2021-10-05 DIAGNOSIS — O99013 Anemia complicating pregnancy, third trimester: Secondary | ICD-10-CM | POA: Diagnosis not present

## 2021-10-05 DIAGNOSIS — Z3402 Encounter for supervision of normal first pregnancy, second trimester: Secondary | ICD-10-CM | POA: Diagnosis not present

## 2021-10-05 DIAGNOSIS — Z23 Encounter for immunization: Secondary | ICD-10-CM | POA: Diagnosis not present

## 2021-10-17 ENCOUNTER — Emergency Department (HOSPITAL_COMMUNITY)
Admission: EM | Admit: 2021-10-17 | Discharge: 2021-10-18 | Discharge status: Against Medical Advice | Payer: Medicaid Other | Attending: Student | Admitting: Student

## 2021-10-17 ENCOUNTER — Emergency Department (HOSPITAL_COMMUNITY): Payer: Medicaid Other

## 2021-10-17 ENCOUNTER — Encounter (HOSPITAL_COMMUNITY): Payer: Self-pay | Admitting: *Deleted

## 2021-10-17 DIAGNOSIS — M25531 Pain in right wrist: Secondary | ICD-10-CM | POA: Insufficient documentation

## 2021-10-17 DIAGNOSIS — Z5321 Procedure and treatment not carried out due to patient leaving prior to being seen by health care provider: Secondary | ICD-10-CM | POA: Insufficient documentation

## 2021-10-17 DIAGNOSIS — M79641 Pain in right hand: Secondary | ICD-10-CM | POA: Diagnosis not present

## 2021-10-17 DIAGNOSIS — O26899 Other specified pregnancy related conditions, unspecified trimester: Secondary | ICD-10-CM | POA: Insufficient documentation

## 2021-10-17 DIAGNOSIS — M25511 Pain in right shoulder: Secondary | ICD-10-CM | POA: Insufficient documentation

## 2021-10-17 DIAGNOSIS — W010XXA Fall on same level from slipping, tripping and stumbling without subsequent striking against object, initial encounter: Secondary | ICD-10-CM | POA: Insufficient documentation

## 2021-10-17 NOTE — ED Triage Notes (Addendum)
Pt states she is [redacted] weeks pregnant.  States that around 1800 she slipped on a wet floor and fell on her hands and then her stomach.  States since then she can feel fetal movement, but does feel tightness in her pelvic area.  Denies vaginal bleeding.    PT c/o pain to R arm and hand and R knee as well as stomach

## 2021-10-17 NOTE — ED Provider Triage Note (Signed)
Emergency Medicine Provider Triage Evaluation Note  Stephanie Zamora , a 26 y.o. female  was evaluated in triage.  Pt complains of right shoulder, wrist, and hand pain secondary to a fall.  Patient states she slipped and fell falling forward onto her outstretched arms, also hitting her abdomen.  Patient is [redacted] weeks pregnant.  She does endorse feeling vigorous fetal movements at this time.  Fetal heart tones were measured in triage.  Patient denies losing consciousness.  Review of Systems  Positive: As above Negative: As above  Physical Exam  BP 123/77 (BP Location: Right Arm)   Pulse 97   Temp 98.1 F (36.7 C) (Oral)   Resp 12   Ht 4' 11.5" (1.511 m)   Wt 52.6 kg   LMP 03/22/2021 (Approximate)   SpO2 99%   BMI 23.04 kg/m  Gen:   Awake, no distress   Resp:  Normal effort  MSK:   Moves extremities without difficulty  Other:    Medical Decision Making  Medically screening exam initiated at 10:34 PM.  Appropriate orders placed.  Arryn Terrones was informed that the remainder of the evaluation will be completed by another provider, this initial triage assessment does not replace that evaluation, and the importance of remaining in the ED until their evaluation is complete.     Dorothyann Peng, PA-C 10/17/21 2235

## 2021-10-18 DIAGNOSIS — Z419 Encounter for procedure for purposes other than remedying health state, unspecified: Secondary | ICD-10-CM | POA: Diagnosis not present

## 2021-10-18 NOTE — ED Notes (Signed)
Pt named called for updated vitals, no response 

## 2021-10-20 DIAGNOSIS — O365930 Maternal care for other known or suspected poor fetal growth, third trimester, not applicable or unspecified: Secondary | ICD-10-CM | POA: Diagnosis not present

## 2021-10-24 DIAGNOSIS — O24419 Gestational diabetes mellitus in pregnancy, unspecified control: Secondary | ICD-10-CM | POA: Diagnosis not present

## 2021-10-24 DIAGNOSIS — Z3A30 30 weeks gestation of pregnancy: Secondary | ICD-10-CM | POA: Diagnosis not present

## 2021-10-30 DIAGNOSIS — R350 Frequency of micturition: Secondary | ICD-10-CM | POA: Diagnosis not present

## 2021-10-30 DIAGNOSIS — N898 Other specified noninflammatory disorders of vagina: Secondary | ICD-10-CM | POA: Diagnosis not present

## 2021-10-30 DIAGNOSIS — O26893 Other specified pregnancy related conditions, third trimester: Secondary | ICD-10-CM | POA: Diagnosis not present

## 2021-11-02 DIAGNOSIS — J069 Acute upper respiratory infection, unspecified: Secondary | ICD-10-CM | POA: Diagnosis not present

## 2021-11-11 DIAGNOSIS — Z3689 Encounter for other specified antenatal screening: Secondary | ICD-10-CM | POA: Diagnosis not present

## 2021-11-11 DIAGNOSIS — N858 Other specified noninflammatory disorders of uterus: Secondary | ICD-10-CM | POA: Diagnosis not present

## 2021-11-11 DIAGNOSIS — O321XX0 Maternal care for breech presentation, not applicable or unspecified: Secondary | ICD-10-CM | POA: Diagnosis not present

## 2021-11-11 DIAGNOSIS — Z3A32 32 weeks gestation of pregnancy: Secondary | ICD-10-CM | POA: Diagnosis not present

## 2021-11-13 DIAGNOSIS — O43193 Other malformation of placenta, third trimester: Secondary | ICD-10-CM | POA: Diagnosis not present

## 2021-11-18 DIAGNOSIS — Z419 Encounter for procedure for purposes other than remedying health state, unspecified: Secondary | ICD-10-CM | POA: Diagnosis not present

## 2021-11-24 ENCOUNTER — Inpatient Hospital Stay (HOSPITAL_COMMUNITY)
Admission: AD | Admit: 2021-11-24 | Discharge: 2021-11-25 | Disposition: A | Discharge status: Home / Self Care | Payer: Medicaid Other | Attending: Obstetrics and Gynecology | Admitting: Obstetrics and Gynecology

## 2021-11-24 ENCOUNTER — Encounter (HOSPITAL_COMMUNITY): Payer: Self-pay | Admitting: *Deleted

## 2021-11-24 DIAGNOSIS — O4700 False labor before 37 completed weeks of gestation, unspecified trimester: Secondary | ICD-10-CM

## 2021-11-24 DIAGNOSIS — O99613 Diseases of the digestive system complicating pregnancy, third trimester: Secondary | ICD-10-CM | POA: Diagnosis not present

## 2021-11-24 DIAGNOSIS — O368130 Decreased fetal movements, third trimester, not applicable or unspecified: Secondary | ICD-10-CM | POA: Diagnosis not present

## 2021-11-24 DIAGNOSIS — Z3A34 34 weeks gestation of pregnancy: Secondary | ICD-10-CM | POA: Insufficient documentation

## 2021-11-24 DIAGNOSIS — K5900 Constipation, unspecified: Secondary | ICD-10-CM | POA: Diagnosis present

## 2021-11-24 DIAGNOSIS — Z79899 Other long term (current) drug therapy: Secondary | ICD-10-CM | POA: Insufficient documentation

## 2021-11-24 DIAGNOSIS — O4703 False labor before 37 completed weeks of gestation, third trimester: Secondary | ICD-10-CM | POA: Diagnosis not present

## 2021-11-24 HISTORY — DX: Other specified health status: Z78.9

## 2021-11-24 LAB — COMPREHENSIVE METABOLIC PANEL
ALT: 7 U/L (ref 0–44)
AST: 15 U/L (ref 15–41)
Albumin: 2.4 g/dL — ABNORMAL LOW (ref 3.5–5.0)
Alkaline Phosphatase: 142 U/L — ABNORMAL HIGH (ref 38–126)
Anion gap: 9 (ref 5–15)
BUN: 8 mg/dL (ref 6–20)
CO2: 23 mmol/L (ref 22–32)
Calcium: 11.6 mg/dL — ABNORMAL HIGH (ref 8.9–10.3)
Chloride: 105 mmol/L (ref 98–111)
Creatinine, Ser: 0.87 mg/dL (ref 0.44–1.00)
GFR, Estimated: >60 mL/min (ref >60)
Glucose, Bld: 101 mg/dL — ABNORMAL HIGH (ref 70–99)
Potassium: 3.8 mmol/L (ref 3.5–5.1)
Sodium: 137 mmol/L (ref 135–145)
Total Bilirubin: 0.5 mg/dL (ref 0.3–1.2)
Total Protein: 5.7 g/dL — ABNORMAL LOW (ref 6.5–8.1)

## 2021-11-24 LAB — URINALYSIS, ROUTINE W REFLEX MICROSCOPIC
Bilirubin Urine: NEGATIVE
Glucose, UA: NEGATIVE mg/dL
Hgb urine dipstick: NEGATIVE
Ketones, ur: NEGATIVE mg/dL
Nitrite: NEGATIVE
Protein, ur: 30 mg/dL — AB
Specific Gravity, Urine: 1.019 (ref 1.005–1.030)
pH: 6 (ref 5.0–8.0)

## 2021-11-24 LAB — CBC WITH DIFFERENTIAL/PLATELET
Abs Immature Granulocytes: 0.05 10*3/uL (ref 0.00–0.07)
Basophils Absolute: 0 10*3/uL (ref 0.0–0.1)
Basophils Relative: 0 %
Eosinophils Absolute: 0 10*3/uL (ref 0.0–0.5)
Eosinophils Relative: 0 %
HCT: 31.3 % — ABNORMAL LOW (ref 36.0–46.0)
Hemoglobin: 11.3 g/dL — ABNORMAL LOW (ref 12.0–15.0)
Immature Granulocytes: 1 %
Lymphocytes Relative: 16 %
Lymphs Abs: 1.6 10*3/uL (ref 0.7–4.0)
MCH: 31.7 pg (ref 26.0–34.0)
MCHC: 36.1 g/dL — ABNORMAL HIGH (ref 30.0–36.0)
MCV: 87.7 fL (ref 80.0–100.0)
Monocytes Absolute: 0.8 10*3/uL (ref 0.1–1.0)
Monocytes Relative: 8 %
Neutro Abs: 7.6 10*3/uL (ref 1.7–7.7)
Neutrophils Relative %: 75 %
Platelets: 185 10*3/uL (ref 150–400)
RBC: 3.57 MIL/uL — ABNORMAL LOW (ref 3.87–5.11)
RDW: 13.1 % (ref 11.5–15.5)
WBC: 10.1 10*3/uL (ref 4.0–10.5)
nRBC: 0 % (ref 0.0–0.2)

## 2021-11-24 LAB — FETAL FIBRONECTIN: Fetal Fibronectin: NEGATIVE

## 2021-11-24 MED ORDER — NIFEDIPINE 10 MG PO CAPS
10.0000 mg | ORAL_CAPSULE | ORAL | Status: DC | PRN
  Administered 2021-11-24 (×3): 10 mg via ORAL
  Filled 2021-11-24 (×3): qty 1

## 2021-11-24 MED ORDER — LACTATED RINGERS IV SOLN: Freq: Once | INTRAVENOUS | Status: AC

## 2021-11-24 MED ORDER — SODIUM CHLORIDE 0.9 % IV SOLN
12.5000 mg | Freq: Once | INTRAVENOUS | Status: AC
  Administered 2021-11-24: 12.5 mg via INTRAVENOUS
  Filled 2021-11-24: qty 0.5

## 2021-11-24 NOTE — MAU Note (Signed)
.  Stephanie Zamora is a 26 y.o. at [redacted]w[redacted]d here in MAU reporting ctxs for last 3 days. Stronger and closer tonight. Decreased FM for 2 days. Unable to eat and drink for last 3 days. Pain L lower back and pelvic pain.  Tylenol helped alittle and tried heating pad. Denies VB or LOF Onset of complaint: 3 days  Pain score: 6 Vitals:   11/24/21 2118 11/24/21 2120  BP:  121/76  Pulse: 96   Resp: 17   Temp: 97.7 F (36.5 C)   SpO2: 99%      FHT:145 Lab orders placed from triage:  u/a

## 2021-11-24 NOTE — MAU Provider Note (Signed)
Chief Complaint:  Contractions   Event Date/Time   First Provider Initiated Contact with Patient 11/24/21 2147     HPI: Stephanie Zamora is a 26 y.o. G2P0 at 11w4dwho presents to maternity admissions reporting contractions and low back pain for 3 days with nausea and vomiting intermittently for 3 days.  Last BM 2 days ago.  Has not taken anything for that. . She reports decreased fetal movement, denies LOF, vaginal bleeding, vaginal itching/burning, urinary symptoms, h/a, dizziness, diarrhea, or fever/chills.   Has gotten care at [facility] but looking for new provider.  Abdominal Pain This is a new problem. The current episode started in the past 7 days. The problem has been unchanged. The pain is located in the LLQ and RLQ. The abdominal pain radiates to the back. Associated symptoms include constipation, nausea and vomiting. Pertinent negatives include no diarrhea or fever. Nothing aggravates the pain. The pain is relieved by Nothing. She has tried nothing for the symptoms.   RN Note: Stephanie Zamora is a 8 y.o. at [redacted]w[redacted]d here in MAU reporting ctxs for last 3 days. Stronger and closer tonight. Decreased FM for 2 days. Unable to eat and drink for last 3 days. Pain L lower back and pelvic pain.  Tylenol helped alittle and tried heating pad. Denies VB or LOF Onset of complaint: 3 days  Past Medical History: Past Medical History:  Diagnosis Date   Medical history non-contributory    Neurofibromatosis ([place])     Past obstetric history: OB History  Gravida Para Term Preterm AB Living  2         1  SAB IAB Ectopic Multiple Live Births          1    # Outcome Date GA Lbr Len/2nd Weight Sex Delivery Anes PTL Lv  2 Current           1 Gravida     M Vag-Spont   LIV    Past Surgical History: Past Surgical History:  Procedure Laterality Date   CARPAL TUNNEL RELEASE      Family History: History reviewed. No pertinent family history.  Social History: Social History   Tobacco Use    Smoking status: Every Day    Packs/day: 0.25    Types: Cigarettes  Vaping Use   Vaping Use: Never used  Substance Use Topics   Alcohol use: No   Drug use: Never    Allergies: No Known Allergies  Meds:  Medications Prior to Admission  Medication Sig Dispense Refill Last Dose   acetaminophen (TYLENOL) 500 MG tablet Take 500 mg by mouth every 6 (six) hours as needed for mild pain.   11/24/2021 at 0800   calcium carbonate (TUMS EX) 750 MG chewable tablet Chew 1 tablet by mouth daily.   11/24/2021   Prenatal Vit-Fe Fumarate-FA (MULTIVITAMIN-PRENATAL) 27-0.8 MG TABS tablet Take 1 tablet by mouth daily at 12 noon.   11/24/2021   guaiFENesin (ROBITUSSIN) 100 MG/5ML liquid Take 5-10 mLs (100-200 mg total) by mouth every 4 (four) hours as needed for cough. 60 mL 0    ibuprofen (ADVIL,MOTRIN) 200 MG tablet Take 200 mg by mouth every 8 (eight) hours as needed for mild pain.      medroxyPROGESTERone (DEPO-PROVERA) 150 MG/ML injection Inject 150 mg into the muscle every 3 (three) months.      naproxen (NAPROSYN) 500 MG tablet Take 1 tablet (500 mg total) by mouth 2 (two) times daily with a meal. 30 tablet 0    promethazine (  PHENERGAN) 25 MG tablet Take 1 tablet (25 mg total) by mouth every 6 (six) hours as needed for nausea or vomiting. 12 tablet 0     I have reviewed patient's Past Medical Hx, Surgical Hx, Family Hx, Social Hx, medications and allergies.   ROS:  Review of Systems  Constitutional:  Negative for chills and fever.  Respiratory:  Negative for shortness of breath.   Gastrointestinal:  Positive for abdominal pain, constipation, nausea and vomiting. Negative for diarrhea.  Genitourinary:  Positive for pelvic pain. Negative for vaginal bleeding.  Musculoskeletal:  Positive for back pain.   Other systems negative  Physical Exam  Patient Vitals for the past 24 hrs:  BP Temp Pulse Resp SpO2 Height Weight  11/24/21 2120 121/76 -- -- -- -- -- --  11/24/21 2118 -- 97.7 F (36.5 C) 96  17 99 % 4\' 11"  (1.499 m) 55.8 kg   Constitutional: Well-developed, well-nourished female in no acute distress.  Cardiovascular: normal rate and rhythm Respiratory: normal effort, clear to auscultation bilaterally GI: Abd soft, non-tender, gravid appropriate for gestational age.   No rebound or guarding. MS: Extremities nontender, no edema, normal ROM Neurologic: Alert and oriented x 4.  GU: Neg CVAT.  PELVIC EXAM:  Dilation: 1 Effacement (%): 60 Station: Ballotable Exam by:: Hansel Feinstein, CNM.   FHT:  Baseline 145 , moderate variability, accelerations present, no decelerations Contractions: Uterine Irritability   Labs: Results for orders placed or performed during the hospital encounter of 11/24/21 (from the past 24 hour(s))  Urinalysis, Routine w reflex microscopic Urine, Clean Catch     Status: Abnormal   Collection Time: 11/24/21  9:38 PM  Result Value Ref Range   Color, Urine YELLOW YELLOW   APPearance CLOUDY (A) CLEAR   Specific Gravity, Urine 1.019 1.005 - 1.030   pH 6.0 5.0 - 8.0   Glucose, UA NEGATIVE NEGATIVE mg/dL   Hgb urine dipstick NEGATIVE NEGATIVE   Bilirubin Urine NEGATIVE NEGATIVE   Ketones, ur NEGATIVE NEGATIVE mg/dL   Protein, ur 30 (A) NEGATIVE mg/dL   Nitrite NEGATIVE NEGATIVE   Leukocytes,Ua LARGE (A) NEGATIVE   RBC / HPF 6-10 0 - 5 RBC/hpf   WBC, UA 21-50 0 - 5 WBC/hpf   Bacteria, UA RARE (A) NONE SEEN   Squamous Epithelial / LPF 6-10 0 - 5   Mucus PRESENT    Hyaline Casts, UA PRESENT    Uric Acid Crys, UA PRESENT   Fetal fibronectin     Status: None   Collection Time: 11/24/21  9:59 PM  Result Value Ref Range   Fetal Fibronectin NEGATIVE NEGATIVE  CBC with Differential/Platelet     Status: Abnormal   Collection Time: 11/24/21 10:22 PM  Result Value Ref Range   WBC 10.1 4.0 - 10.5 K/uL   RBC 3.57 (L) 3.87 - 5.11 MIL/uL   Hemoglobin 11.3 (L) 12.0 - 15.0 g/dL   HCT 31.3 (L) 36.0 - 46.0 %   MCV 87.7 80.0 - 100.0 fL   MCH 31.7 26.0 - 34.0 pg    MCHC 36.1 (H) 30.0 - 36.0 g/dL   RDW 13.1 11.5 - 15.5 %   Platelets 185 150 - 400 K/uL   nRBC 0.0 0.0 - 0.2 %   Neutrophils Relative % 75 %   Neutro Abs 7.6 1.7 - 7.7 K/uL   Lymphocytes Relative 16 %   Lymphs Abs 1.6 0.7 - 4.0 K/uL   Monocytes Relative 8 %   Monocytes Absolute 0.8 0.1 - 1.0  K/uL   Eosinophils Relative 0 %   Eosinophils Absolute 0.0 0.0 - 0.5 K/uL   Basophils Relative 0 %   Basophils Absolute 0.0 0.0 - 0.1 K/uL   Immature Granulocytes 1 %   Abs Immature Granulocytes 0.05 0.00 - 0.07 K/uL  Comprehensive metabolic panel     Status: Abnormal   Collection Time: 11/24/21 10:22 PM  Result Value Ref Range   Sodium 137 135 - 145 mmol/L   Potassium 3.8 3.5 - 5.1 mmol/L   Chloride 105 98 - 111 mmol/L   CO2 23 22 - 32 mmol/L   Glucose, Bld 101 (H) 70 - 99 mg/dL   BUN 8 6 - 20 mg/dL   Creatinine, Ser 0.87 0.44 - 1.00 mg/dL   Calcium 11.6 (H) 8.9 - 10.3 mg/dL   Total Protein 5.7 (L) 6.5 - 8.1 g/dL   Albumin 2.4 (L) 3.5 - 5.0 g/dL   AST 15 15 - 41 U/L   ALT 7 0 - 44 U/L   Alkaline Phosphatase 142 (H) 38 - 126 U/L   Total Bilirubin 0.5 0.3 - 1.2 mg/dL   GFR, Estimated >60 >60 mL/min   Anion gap 9 5 - 15       Imaging:  No results found.  MAU Course/MDM: I have reviewed the triage vital signs and the nursing notes.   Pertinent labs & imaging results that were available during my care of the patient were reviewed by me and considered in my medical decision making (see chart for details).      I have reviewed her medical records including past results, notes and treatments.   I have ordered labs and reviewed results.  NST reviewed, reassuring   Treatments in MAU included FFn collected, IV hydration.  Will give Procardia for contractions.  0005:  Has just had third Procardia.  UCs slowing 0040   UCs diminished   Nausea better  Has had IV fluids and Phenergan            Recheck of Cervix unchanged   Assessment: Single IUP at [redacted]w[redacted]d Preterm Uterine contractions,   negative Fetal Fibronectin, slight cervical dilation  Plan: Discharge home Preterm Labor precautions and fetal kick counts Follow up in Office for prenatal visits and recheck  Has appt at [facility] Friday Message sent to office to see if she can get in for new OB transfer Encouraged to return if she develops worsening of symptoms, increase in pain, fever, or other concerning symptoms.    Pt stable at time of discharge.  Hansel Feinstein CNM, MSN Certified Nurse-Midwife 11/24/2021 9:47 PM

## 2021-11-25 DIAGNOSIS — O4700 False labor before 37 completed weeks of gestation, unspecified trimester: Secondary | ICD-10-CM | POA: Diagnosis not present

## 2021-11-25 DIAGNOSIS — Z3A34 34 weeks gestation of pregnancy: Secondary | ICD-10-CM

## 2021-11-25 MED ORDER — NIFEDIPINE 10 MG PO CAPS: 10.0000 mg | ORAL_CAPSULE | Freq: Four times a day (QID) | ORAL | 0 refills | Status: AC | PRN

## 2021-11-26 LAB — CULTURE, OB URINE

## 2021-11-27 DIAGNOSIS — R102 Pelvic and perineal pain: Secondary | ICD-10-CM | POA: Diagnosis not present

## 2021-11-27 DIAGNOSIS — O26893 Other specified pregnancy related conditions, third trimester: Secondary | ICD-10-CM | POA: Diagnosis not present

## 2021-12-01 DIAGNOSIS — M5459 Other low back pain: Secondary | ICD-10-CM | POA: Diagnosis not present

## 2021-12-01 DIAGNOSIS — Z3A35 35 weeks gestation of pregnancy: Secondary | ICD-10-CM | POA: Diagnosis not present

## 2021-12-01 DIAGNOSIS — O0990 Supervision of high risk pregnancy, unspecified, unspecified trimester: Secondary | ICD-10-CM | POA: Insufficient documentation

## 2021-12-02 ENCOUNTER — Ambulatory Visit: Payer: Medicaid Other | Admitting: Certified Nurse Midwife

## 2021-12-02 DIAGNOSIS — O0993 Supervision of high risk pregnancy, unspecified, third trimester: Secondary | ICD-10-CM

## 2021-12-02 DIAGNOSIS — F902 Attention-deficit hyperactivity disorder, combined type: Secondary | ICD-10-CM

## 2021-12-02 DIAGNOSIS — O0933 Supervision of pregnancy with insufficient antenatal care, third trimester: Secondary | ICD-10-CM

## 2021-12-02 DIAGNOSIS — K219 Gastro-esophageal reflux disease without esophagitis: Secondary | ICD-10-CM

## 2021-12-02 DIAGNOSIS — Z3A35 35 weeks gestation of pregnancy: Secondary | ICD-10-CM

## 2021-12-02 DIAGNOSIS — Q8501 Neurofibromatosis, type 1: Secondary | ICD-10-CM

## 2021-12-02 DIAGNOSIS — O0930 Supervision of pregnancy with insufficient antenatal care, unspecified trimester: Secondary | ICD-10-CM

## 2021-12-02 DIAGNOSIS — O4700 False labor before 37 completed weeks of gestation, unspecified trimester: Secondary | ICD-10-CM

## 2021-12-03 NOTE — Progress Notes (Signed)
Pt did not come to appt

## 2021-12-04 DIAGNOSIS — Z3685 Encounter for antenatal screening for Streptococcus B: Secondary | ICD-10-CM | POA: Diagnosis not present

## 2021-12-04 DIAGNOSIS — Z3483 Encounter for supervision of other normal pregnancy, third trimester: Secondary | ICD-10-CM | POA: Diagnosis not present

## 2021-12-10 DIAGNOSIS — Z3A36 36 weeks gestation of pregnancy: Secondary | ICD-10-CM | POA: Diagnosis not present

## 2021-12-10 DIAGNOSIS — O4703 False labor before 37 completed weeks of gestation, third trimester: Secondary | ICD-10-CM | POA: Diagnosis not present

## 2021-12-18 DIAGNOSIS — Z419 Encounter for procedure for purposes other than remedying health state, unspecified: Secondary | ICD-10-CM | POA: Diagnosis not present

## 2021-12-21 DIAGNOSIS — O365930 Maternal care for other known or suspected poor fetal growth, third trimester, not applicable or unspecified: Secondary | ICD-10-CM | POA: Diagnosis not present

## 2021-12-23 DIAGNOSIS — O134 Gestational [pregnancy-induced] hypertension without significant proteinuria, complicating childbirth: Secondary | ICD-10-CM | POA: Diagnosis not present

## 2021-12-23 DIAGNOSIS — F603 Borderline personality disorder: Secondary | ICD-10-CM | POA: Diagnosis not present

## 2021-12-23 DIAGNOSIS — O365910 Maternal care for other known or suspected poor fetal growth, first trimester, not applicable or unspecified: Secondary | ICD-10-CM | POA: Diagnosis not present

## 2021-12-23 DIAGNOSIS — O43193 Other malformation of placenta, third trimester: Secondary | ICD-10-CM | POA: Diagnosis not present

## 2021-12-23 DIAGNOSIS — O43813 Placental infarction, third trimester: Secondary | ICD-10-CM | POA: Diagnosis not present

## 2021-12-23 DIAGNOSIS — O99334 Smoking (tobacco) complicating childbirth: Secondary | ICD-10-CM | POA: Diagnosis not present

## 2021-12-23 DIAGNOSIS — O99344 Other mental disorders complicating childbirth: Secondary | ICD-10-CM | POA: Diagnosis not present

## 2021-12-23 DIAGNOSIS — O99340 Other mental disorders complicating pregnancy, unspecified trimester: Secondary | ICD-10-CM | POA: Diagnosis not present

## 2021-12-23 DIAGNOSIS — F4310 Post-traumatic stress disorder, unspecified: Secondary | ICD-10-CM | POA: Diagnosis not present

## 2021-12-23 DIAGNOSIS — F17200 Nicotine dependence, unspecified, uncomplicated: Secondary | ICD-10-CM | POA: Diagnosis not present

## 2021-12-23 DIAGNOSIS — O133 Gestational [pregnancy-induced] hypertension without significant proteinuria, third trimester: Secondary | ICD-10-CM | POA: Diagnosis not present

## 2021-12-23 DIAGNOSIS — O365930 Maternal care for other known or suspected poor fetal growth, third trimester, not applicable or unspecified: Secondary | ICD-10-CM | POA: Diagnosis not present

## 2021-12-23 DIAGNOSIS — Z3A38 38 weeks gestation of pregnancy: Secondary | ICD-10-CM | POA: Diagnosis not present

## 2021-12-23 DIAGNOSIS — O43893 Other placental disorders, third trimester: Secondary | ICD-10-CM | POA: Diagnosis not present

## 2022-01-18 DIAGNOSIS — Z419 Encounter for procedure for purposes other than remedying health state, unspecified: Secondary | ICD-10-CM | POA: Diagnosis not present

## 2022-02-02 DIAGNOSIS — Z3202 Encounter for pregnancy test, result negative: Secondary | ICD-10-CM | POA: Diagnosis not present

## 2022-02-18 DIAGNOSIS — Z419 Encounter for procedure for purposes other than remedying health state, unspecified: Secondary | ICD-10-CM | POA: Diagnosis not present

## 2022-03-02 DIAGNOSIS — Z8659 Personal history of other mental and behavioral disorders: Secondary | ICD-10-CM | POA: Diagnosis not present

## 2022-03-02 DIAGNOSIS — F4310 Post-traumatic stress disorder, unspecified: Secondary | ICD-10-CM | POA: Diagnosis not present

## 2022-03-02 DIAGNOSIS — F331 Major depressive disorder, recurrent, moderate: Secondary | ICD-10-CM | POA: Diagnosis not present

## 2022-03-02 DIAGNOSIS — F411 Generalized anxiety disorder: Secondary | ICD-10-CM | POA: Diagnosis not present

## 2022-03-02 DIAGNOSIS — F902 Attention-deficit hyperactivity disorder, combined type: Secondary | ICD-10-CM | POA: Diagnosis not present

## 2022-03-02 DIAGNOSIS — F410 Panic disorder [episodic paroxysmal anxiety] without agoraphobia: Secondary | ICD-10-CM | POA: Diagnosis not present

## 2022-03-19 DIAGNOSIS — Z419 Encounter for procedure for purposes other than remedying health state, unspecified: Secondary | ICD-10-CM | POA: Diagnosis not present

## 2022-03-23 DIAGNOSIS — F902 Attention-deficit hyperactivity disorder, combined type: Secondary | ICD-10-CM | POA: Diagnosis not present

## 2022-03-23 DIAGNOSIS — F410 Panic disorder [episodic paroxysmal anxiety] without agoraphobia: Secondary | ICD-10-CM | POA: Diagnosis not present

## 2022-03-23 DIAGNOSIS — F331 Major depressive disorder, recurrent, moderate: Secondary | ICD-10-CM | POA: Diagnosis not present

## 2022-03-23 DIAGNOSIS — F411 Generalized anxiety disorder: Secondary | ICD-10-CM | POA: Diagnosis not present

## 2022-03-23 DIAGNOSIS — F4310 Post-traumatic stress disorder, unspecified: Secondary | ICD-10-CM | POA: Diagnosis not present

## 2022-03-23 DIAGNOSIS — Z8659 Personal history of other mental and behavioral disorders: Secondary | ICD-10-CM | POA: Diagnosis not present

## 2022-03-23 DIAGNOSIS — G4700 Insomnia, unspecified: Secondary | ICD-10-CM | POA: Diagnosis not present

## 2022-04-06 DIAGNOSIS — N912 Amenorrhea, unspecified: Secondary | ICD-10-CM | POA: Diagnosis not present

## 2022-04-19 DIAGNOSIS — Z419 Encounter for procedure for purposes other than remedying health state, unspecified: Secondary | ICD-10-CM | POA: Diagnosis not present

## 2022-05-05 DIAGNOSIS — F410 Panic disorder [episodic paroxysmal anxiety] without agoraphobia: Secondary | ICD-10-CM | POA: Diagnosis not present

## 2022-05-05 DIAGNOSIS — Z8659 Personal history of other mental and behavioral disorders: Secondary | ICD-10-CM | POA: Diagnosis not present

## 2022-05-05 DIAGNOSIS — F331 Major depressive disorder, recurrent, moderate: Secondary | ICD-10-CM | POA: Diagnosis not present

## 2022-05-05 DIAGNOSIS — F411 Generalized anxiety disorder: Secondary | ICD-10-CM | POA: Diagnosis not present

## 2022-05-05 DIAGNOSIS — F4310 Post-traumatic stress disorder, unspecified: Secondary | ICD-10-CM | POA: Diagnosis not present

## 2022-05-05 DIAGNOSIS — F902 Attention-deficit hyperactivity disorder, combined type: Secondary | ICD-10-CM | POA: Diagnosis not present

## 2022-05-05 DIAGNOSIS — G4700 Insomnia, unspecified: Secondary | ICD-10-CM | POA: Diagnosis not present

## 2022-05-19 ENCOUNTER — Telehealth: Payer: Self-pay | Admitting: Family Medicine

## 2022-05-19 DIAGNOSIS — Z419 Encounter for procedure for purposes other than remedying health state, unspecified: Secondary | ICD-10-CM | POA: Diagnosis not present

## 2022-05-19 NOTE — Telephone Encounter (Signed)
Received a message from Gala Murdoch H to see if another office can do patient intake appointment tomorrow if not we would have to reschedule due to short staff,  I asked the other offices and they didn't have any open slots for tomorrow.  Before me rescheduling the appointment I spoke with [place] and she said  we should reschedule both the intake and the new ob appointment. I called the patient with no answer so I sent her a mychart message

## 2022-05-20 ENCOUNTER — Telehealth: Payer: Medicaid Other

## 2022-05-27 ENCOUNTER — Encounter: Payer: Self-pay | Admitting: Family Medicine

## 2022-05-28 DIAGNOSIS — O3680X0 Pregnancy with inconclusive fetal viability, not applicable or unspecified: Secondary | ICD-10-CM | POA: Diagnosis not present

## 2022-05-28 DIAGNOSIS — Z8632 Personal history of gestational diabetes: Secondary | ICD-10-CM | POA: Diagnosis not present

## 2022-05-28 DIAGNOSIS — O09891 Supervision of other high risk pregnancies, first trimester: Secondary | ICD-10-CM | POA: Diagnosis not present

## 2022-05-28 DIAGNOSIS — O09299 Supervision of pregnancy with other poor reproductive or obstetric history, unspecified trimester: Secondary | ICD-10-CM | POA: Diagnosis not present

## 2022-05-28 DIAGNOSIS — Z3481 Encounter for supervision of other normal pregnancy, first trimester: Secondary | ICD-10-CM | POA: Diagnosis not present

## 2022-05-28 DIAGNOSIS — Z3689 Encounter for other specified antenatal screening: Secondary | ICD-10-CM | POA: Diagnosis not present

## 2022-05-28 DIAGNOSIS — Z3A13 13 weeks gestation of pregnancy: Secondary | ICD-10-CM | POA: Diagnosis not present

## 2022-06-03 DIAGNOSIS — E875 Hyperkalemia: Secondary | ICD-10-CM | POA: Diagnosis not present

## 2022-06-08 DIAGNOSIS — Z8632 Personal history of gestational diabetes: Secondary | ICD-10-CM | POA: Diagnosis not present

## 2022-06-08 DIAGNOSIS — O09292 Supervision of pregnancy with other poor reproductive or obstetric history, second trimester: Secondary | ICD-10-CM | POA: Diagnosis not present

## 2022-06-08 DIAGNOSIS — O09892 Supervision of other high risk pregnancies, second trimester: Secondary | ICD-10-CM | POA: Diagnosis not present

## 2022-06-08 DIAGNOSIS — Q8501 Neurofibromatosis, type 1: Secondary | ICD-10-CM | POA: Diagnosis not present

## 2022-06-08 DIAGNOSIS — F339 Major depressive disorder, recurrent, unspecified: Secondary | ICD-10-CM | POA: Diagnosis not present

## 2022-06-08 DIAGNOSIS — O99342 Other mental disorders complicating pregnancy, second trimester: Secondary | ICD-10-CM | POA: Diagnosis not present

## 2022-06-19 DIAGNOSIS — Z419 Encounter for procedure for purposes other than remedying health state, unspecified: Secondary | ICD-10-CM | POA: Diagnosis not present

## 2022-06-22 ENCOUNTER — Telehealth: Payer: Medicaid Other

## 2022-06-24 DIAGNOSIS — M545 Low back pain, unspecified: Secondary | ICD-10-CM | POA: Diagnosis not present

## 2022-06-24 DIAGNOSIS — Z3A17 17 weeks gestation of pregnancy: Secondary | ICD-10-CM | POA: Diagnosis not present

## 2022-06-28 ENCOUNTER — Encounter: Payer: Medicaid Other | Admitting: Obstetrics & Gynecology

## 2022-07-15 DIAGNOSIS — Z3689 Encounter for other specified antenatal screening: Secondary | ICD-10-CM | POA: Diagnosis not present

## 2022-07-19 DIAGNOSIS — F4310 Post-traumatic stress disorder, unspecified: Secondary | ICD-10-CM | POA: Diagnosis not present

## 2022-07-19 DIAGNOSIS — F900 Attention-deficit hyperactivity disorder, predominantly inattentive type: Secondary | ICD-10-CM | POA: Diagnosis not present

## 2022-07-19 DIAGNOSIS — F332 Major depressive disorder, recurrent severe without psychotic features: Secondary | ICD-10-CM | POA: Diagnosis not present

## 2022-07-19 DIAGNOSIS — Z419 Encounter for procedure for purposes other than remedying health state, unspecified: Secondary | ICD-10-CM | POA: Diagnosis not present

## 2022-08-19 DIAGNOSIS — Z419 Encounter for procedure for purposes other than remedying health state, unspecified: Secondary | ICD-10-CM | POA: Diagnosis not present

## 2022-09-09 DIAGNOSIS — F339 Major depressive disorder, recurrent, unspecified: Secondary | ICD-10-CM | POA: Diagnosis not present

## 2022-09-09 DIAGNOSIS — F900 Attention-deficit hyperactivity disorder, predominantly inattentive type: Secondary | ICD-10-CM | POA: Diagnosis not present

## 2022-09-09 DIAGNOSIS — F332 Major depressive disorder, recurrent severe without psychotic features: Secondary | ICD-10-CM | POA: Diagnosis not present

## 2022-09-09 DIAGNOSIS — O23593 Infection of other part of genital tract in pregnancy, third trimester: Secondary | ICD-10-CM | POA: Diagnosis not present

## 2022-09-09 DIAGNOSIS — M549 Dorsalgia, unspecified: Secondary | ICD-10-CM | POA: Diagnosis not present

## 2022-09-09 DIAGNOSIS — Z3689 Encounter for other specified antenatal screening: Secondary | ICD-10-CM | POA: Diagnosis not present

## 2022-09-09 DIAGNOSIS — O99891 Other specified diseases and conditions complicating pregnancy: Secondary | ICD-10-CM | POA: Diagnosis not present

## 2022-09-09 DIAGNOSIS — Z3A28 28 weeks gestation of pregnancy: Secondary | ICD-10-CM | POA: Diagnosis not present

## 2022-09-09 DIAGNOSIS — N898 Other specified noninflammatory disorders of vagina: Secondary | ICD-10-CM | POA: Diagnosis not present

## 2022-09-09 DIAGNOSIS — F4310 Post-traumatic stress disorder, unspecified: Secondary | ICD-10-CM | POA: Diagnosis not present

## 2022-09-17 DIAGNOSIS — K599 Functional intestinal disorder, unspecified: Secondary | ICD-10-CM | POA: Diagnosis not present

## 2022-09-17 DIAGNOSIS — N319 Neuromuscular dysfunction of bladder, unspecified: Secondary | ICD-10-CM | POA: Diagnosis not present

## 2022-09-17 DIAGNOSIS — M545 Low back pain, unspecified: Secondary | ICD-10-CM | POA: Diagnosis not present

## 2022-09-17 DIAGNOSIS — R2689 Other abnormalities of gait and mobility: Secondary | ICD-10-CM | POA: Diagnosis not present

## 2022-09-17 DIAGNOSIS — R7309 Other abnormal glucose: Secondary | ICD-10-CM | POA: Diagnosis not present

## 2022-09-19 DIAGNOSIS — Z419 Encounter for procedure for purposes other than remedying health state, unspecified: Secondary | ICD-10-CM | POA: Diagnosis not present

## 2022-09-24 DIAGNOSIS — Z3689 Encounter for other specified antenatal screening: Secondary | ICD-10-CM | POA: Diagnosis not present

## 2022-09-24 DIAGNOSIS — O321XX1 Maternal care for breech presentation, fetus 1: Secondary | ICD-10-CM | POA: Diagnosis not present

## 2022-10-08 DIAGNOSIS — F4310 Post-traumatic stress disorder, unspecified: Secondary | ICD-10-CM | POA: Diagnosis not present

## 2022-10-08 DIAGNOSIS — N898 Other specified noninflammatory disorders of vagina: Secondary | ICD-10-CM | POA: Diagnosis not present

## 2022-10-08 DIAGNOSIS — F900 Attention-deficit hyperactivity disorder, predominantly inattentive type: Secondary | ICD-10-CM | POA: Diagnosis not present

## 2022-10-08 DIAGNOSIS — R82998 Other abnormal findings in urine: Secondary | ICD-10-CM | POA: Diagnosis not present

## 2022-10-08 DIAGNOSIS — O26893 Other specified pregnancy related conditions, third trimester: Secondary | ICD-10-CM | POA: Diagnosis not present

## 2022-10-08 DIAGNOSIS — F332 Major depressive disorder, recurrent severe without psychotic features: Secondary | ICD-10-CM | POA: Diagnosis not present

## 2022-10-08 DIAGNOSIS — Z3A32 32 weeks gestation of pregnancy: Secondary | ICD-10-CM | POA: Diagnosis not present

## 2022-10-19 DIAGNOSIS — Z419 Encounter for procedure for purposes other than remedying health state, unspecified: Secondary | ICD-10-CM | POA: Diagnosis not present

## 2022-10-22 DIAGNOSIS — R82998 Other abnormal findings in urine: Secondary | ICD-10-CM | POA: Diagnosis not present

## 2022-10-22 DIAGNOSIS — Z3689 Encounter for other specified antenatal screening: Secondary | ICD-10-CM | POA: Diagnosis not present

## 2022-10-22 DIAGNOSIS — Z369 Encounter for antenatal screening, unspecified: Secondary | ICD-10-CM | POA: Diagnosis not present

## 2022-10-22 DIAGNOSIS — O09293 Supervision of pregnancy with other poor reproductive or obstetric history, third trimester: Secondary | ICD-10-CM | POA: Diagnosis not present

## 2022-10-22 DIAGNOSIS — Z3A34 34 weeks gestation of pregnancy: Secondary | ICD-10-CM | POA: Diagnosis not present

## 2022-11-05 DIAGNOSIS — Z3A36 36 weeks gestation of pregnancy: Secondary | ICD-10-CM | POA: Diagnosis not present

## 2022-11-05 DIAGNOSIS — Z369 Encounter for antenatal screening, unspecified: Secondary | ICD-10-CM | POA: Diagnosis not present

## 2022-11-08 DIAGNOSIS — R32 Unspecified urinary incontinence: Secondary | ICD-10-CM | POA: Diagnosis not present

## 2022-11-08 DIAGNOSIS — Z3A36 36 weeks gestation of pregnancy: Secondary | ICD-10-CM | POA: Diagnosis not present

## 2022-11-08 DIAGNOSIS — O4703 False labor before 37 completed weeks of gestation, third trimester: Secondary | ICD-10-CM | POA: Diagnosis not present

## 2022-11-08 DIAGNOSIS — O99333 Smoking (tobacco) complicating pregnancy, third trimester: Secondary | ICD-10-CM | POA: Diagnosis not present

## 2022-11-08 DIAGNOSIS — O26893 Other specified pregnancy related conditions, third trimester: Secondary | ICD-10-CM | POA: Diagnosis not present

## 2022-11-11 DIAGNOSIS — Z3A37 37 weeks gestation of pregnancy: Secondary | ICD-10-CM | POA: Diagnosis not present

## 2022-11-11 DIAGNOSIS — Z7982 Long term (current) use of aspirin: Secondary | ICD-10-CM | POA: Diagnosis not present

## 2022-11-11 DIAGNOSIS — F32A Depression, unspecified: Secondary | ICD-10-CM | POA: Diagnosis not present

## 2022-11-11 DIAGNOSIS — F603 Borderline personality disorder: Secondary | ICD-10-CM | POA: Diagnosis not present

## 2022-11-11 DIAGNOSIS — O9902 Anemia complicating childbirth: Secondary | ICD-10-CM | POA: Diagnosis not present

## 2022-11-11 DIAGNOSIS — Q8501 Neurofibromatosis, type 1: Secondary | ICD-10-CM | POA: Diagnosis not present

## 2022-11-11 DIAGNOSIS — O99344 Other mental disorders complicating childbirth: Secondary | ICD-10-CM | POA: Diagnosis not present

## 2022-11-11 DIAGNOSIS — F419 Anxiety disorder, unspecified: Secondary | ICD-10-CM | POA: Diagnosis not present

## 2022-11-11 DIAGNOSIS — Z8632 Personal history of gestational diabetes: Secondary | ICD-10-CM | POA: Diagnosis not present

## 2022-11-11 DIAGNOSIS — F17210 Nicotine dependence, cigarettes, uncomplicated: Secondary | ICD-10-CM | POA: Diagnosis not present

## 2022-11-11 DIAGNOSIS — F4310 Post-traumatic stress disorder, unspecified: Secondary | ICD-10-CM | POA: Diagnosis not present

## 2022-11-11 DIAGNOSIS — O99334 Smoking (tobacco) complicating childbirth: Secondary | ICD-10-CM | POA: Diagnosis not present

## 2022-11-12 DIAGNOSIS — F339 Major depressive disorder, recurrent, unspecified: Secondary | ICD-10-CM | POA: Diagnosis not present

## 2022-11-12 DIAGNOSIS — F900 Attention-deficit hyperactivity disorder, predominantly inattentive type: Secondary | ICD-10-CM | POA: Diagnosis not present

## 2022-11-12 DIAGNOSIS — F4310 Post-traumatic stress disorder, unspecified: Secondary | ICD-10-CM | POA: Diagnosis not present

## 2022-11-12 DIAGNOSIS — F332 Major depressive disorder, recurrent severe without psychotic features: Secondary | ICD-10-CM | POA: Diagnosis not present

## 2022-12-02 DIAGNOSIS — Z3483 Encounter for supervision of other normal pregnancy, third trimester: Secondary | ICD-10-CM | POA: Diagnosis not present

## 2022-12-02 DIAGNOSIS — Z3482 Encounter for supervision of other normal pregnancy, second trimester: Secondary | ICD-10-CM | POA: Diagnosis not present
